# Patient Record
Sex: Male | Born: 2000 | Race: White | Hispanic: No | Marital: Single | Smoking: Never smoker
Health system: Southern US, Community
[De-identification: ages and names within clinical notes are randomized; demographics above are authoritative.]

## PROBLEM LIST (undated history)

## (undated) DIAGNOSIS — Z803 Family history of malignant neoplasm of breast: Secondary | ICD-10-CM

## (undated) HISTORY — DX: Family history of malignant neoplasm of breast: Z80.3

---
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
## 2020-09-10 IMAGING — DX DG ANKLE COMPLETE 3+V*R*
3 series · 3 of 3 positions shown · non-contrast
Comparison: None.

CLINICAL DATA: 18-year-old male with trauma to the right ankle.

EXAM:
RIGHT ANKLE - COMPLETE 3+ VIEW

[ankle ap]
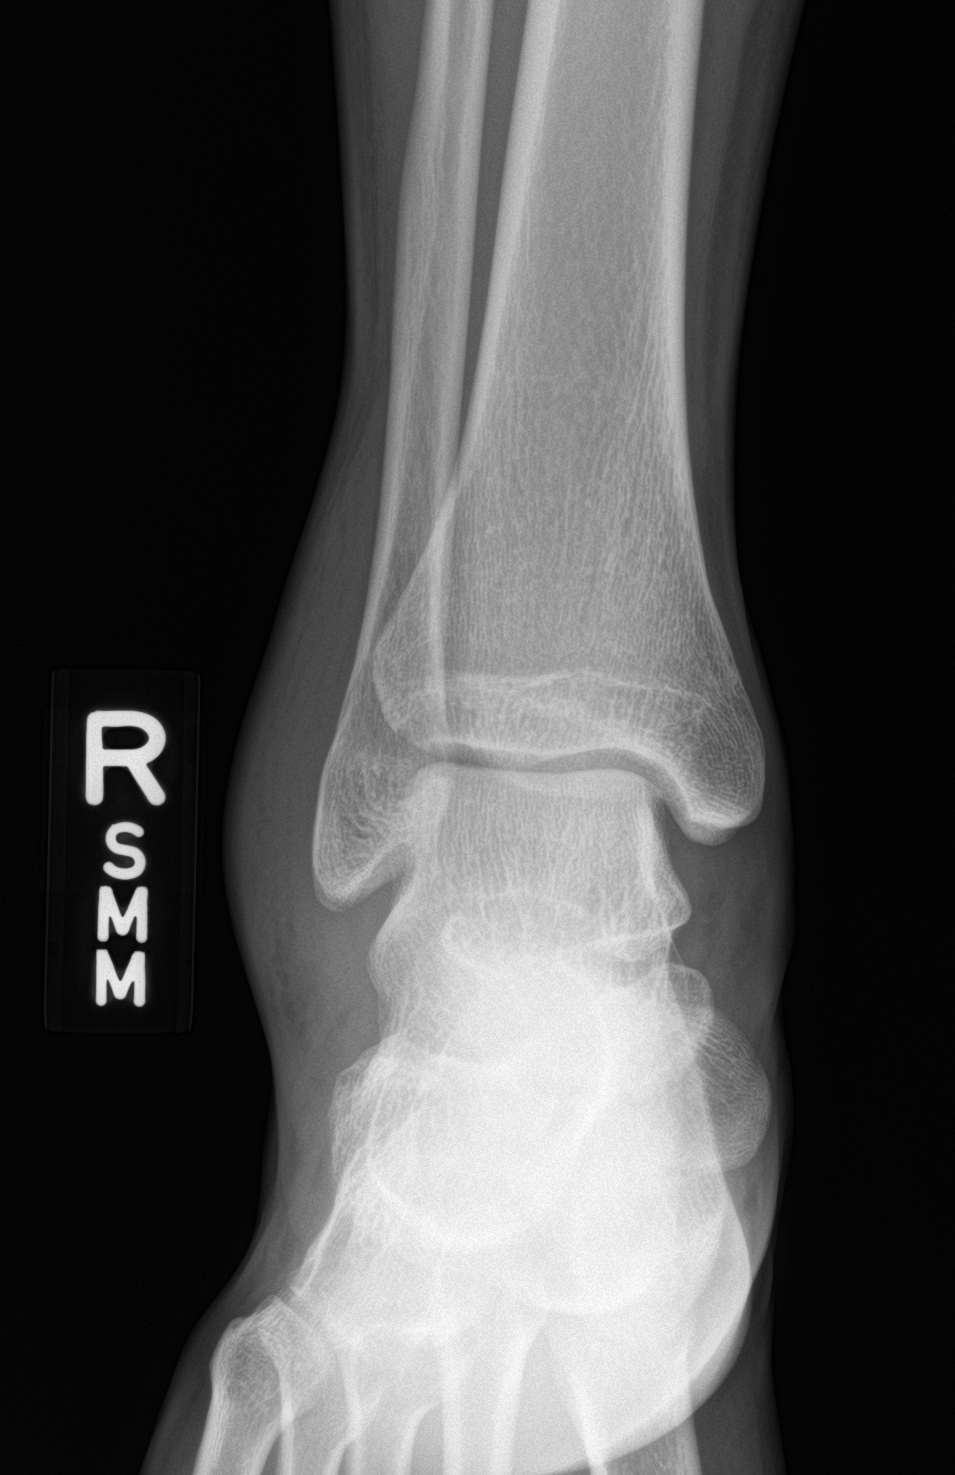

[ankle obl]
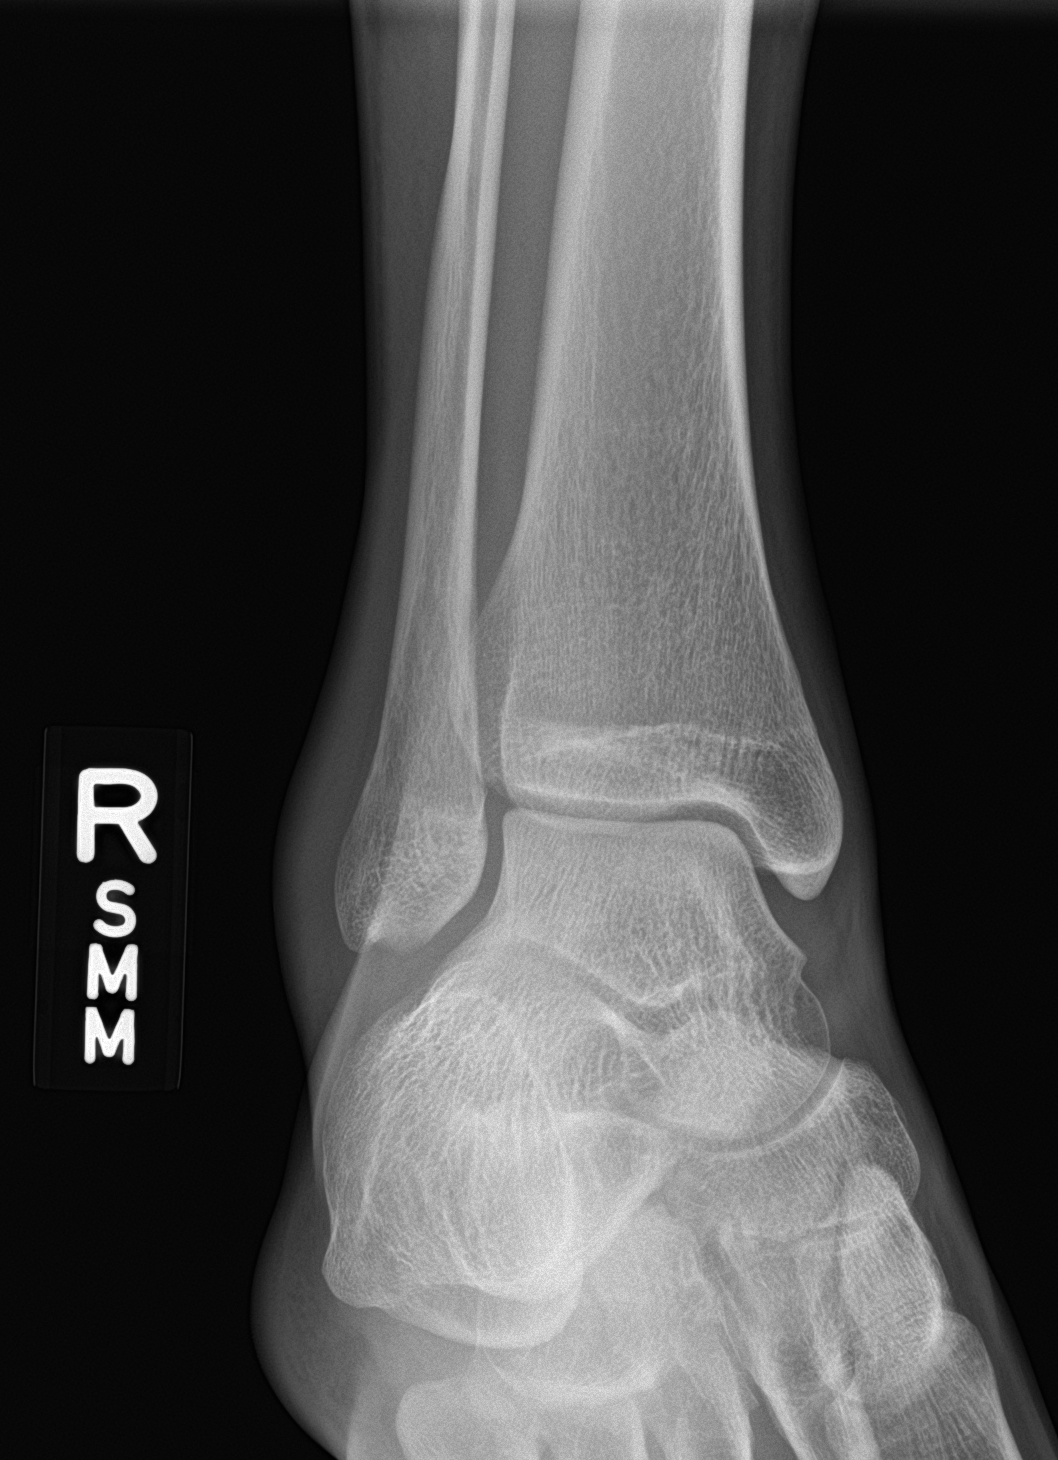

[ankle lat]
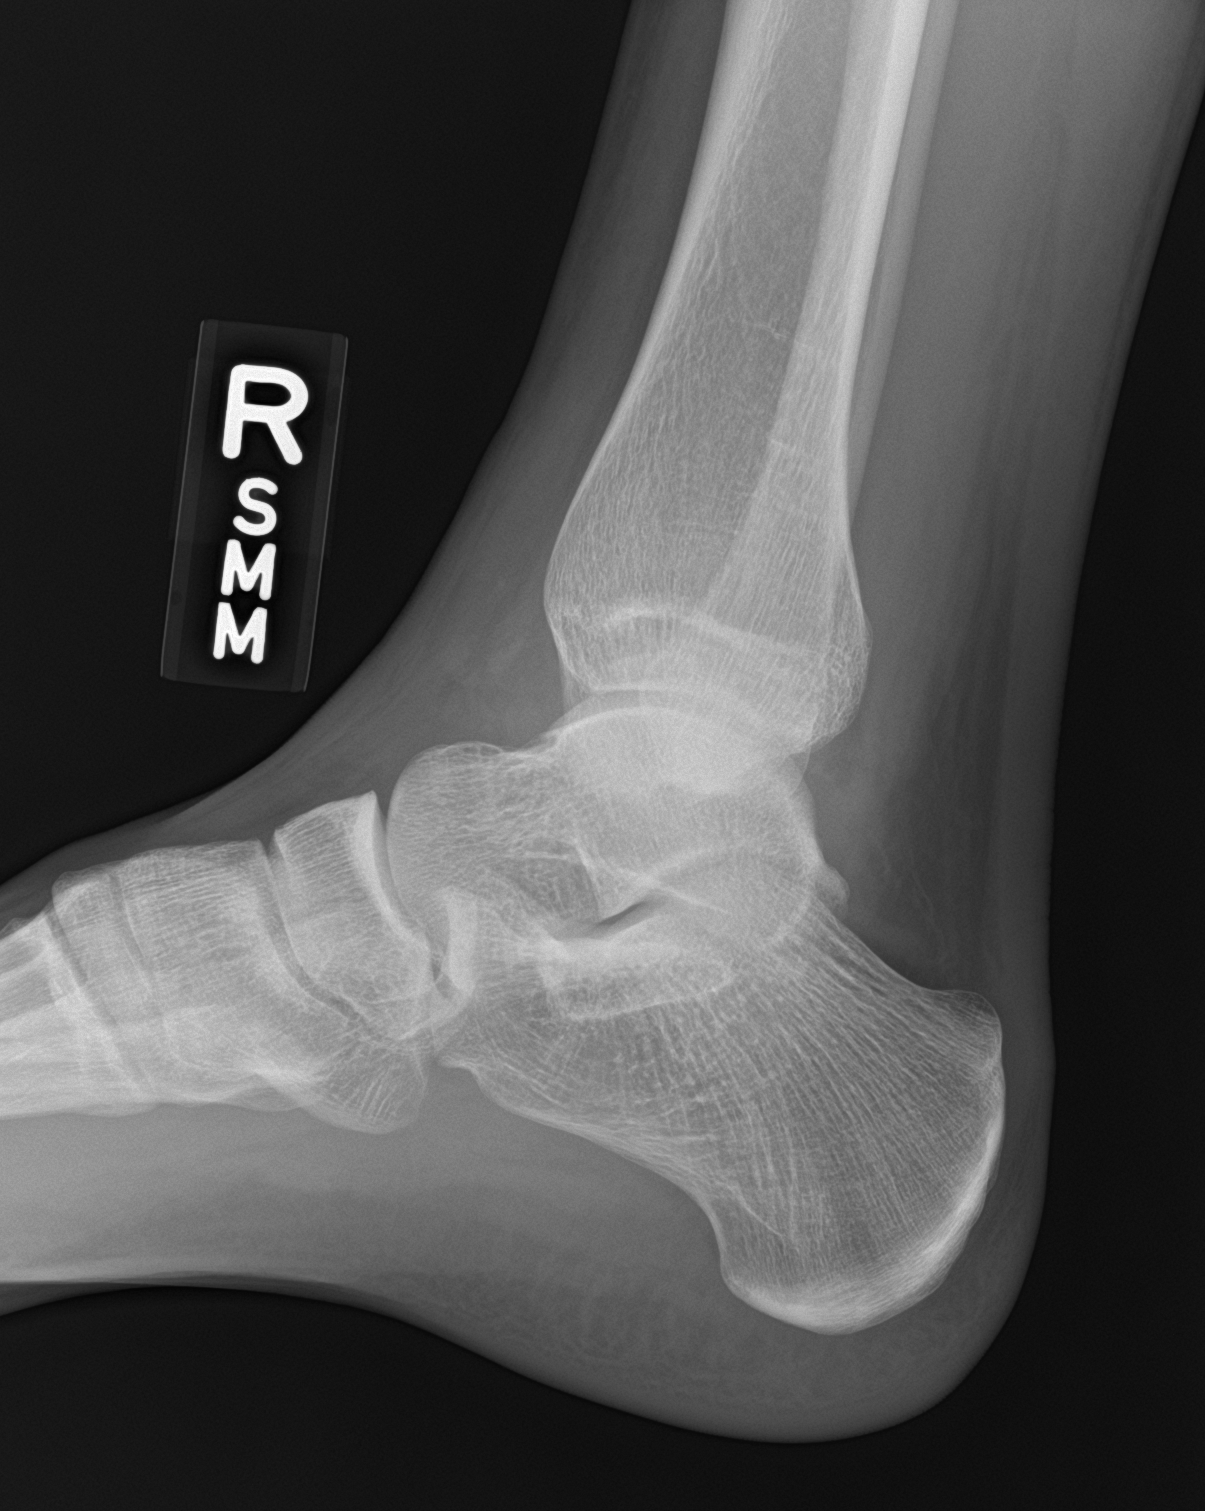

[3 of 3 positions shown; findings below may reference images not displayed]

FINDINGS: There is no acute fracture or dislocation. The bones are well
mineralized. No arthritic changes. The ankle mortise is intact. The
soft tissue swelling over the lateral malleolus. No radiopaque
foreign object or soft tissue gas.
IMPRESSION: No acute fracture or dislocation.

## 2021-03-09 ENCOUNTER — Other Ambulatory Visit: Payer: Self-pay | Admitting: Genetic Counselor

## 2021-03-09 DIAGNOSIS — Z803 Family history of malignant neoplasm of breast: Secondary | ICD-10-CM

## 2021-03-10 ENCOUNTER — Inpatient Hospital Stay: Payer: 59

## 2021-03-10 ENCOUNTER — Inpatient Hospital Stay: Payer: 59 | Attending: Genetic Counselor | Admitting: Genetic Counselor

## 2021-03-10 ENCOUNTER — Other Ambulatory Visit: Payer: Self-pay

## 2021-03-10 ENCOUNTER — Encounter: Payer: Self-pay | Admitting: Genetic Counselor

## 2021-03-10 DIAGNOSIS — Z803 Family history of malignant neoplasm of breast: Secondary | ICD-10-CM

## 2021-03-10 NOTE — Progress Notes (Addendum)
REFERRING PROVIDER: Chauncey Cruel, MD [place],  [place] [id number]  PRIMARY PROVIDER:  Patient, [name]  PRIMARY REASON FOR VISIT:  1. Family history of breast cancer      HISTORY OF PRESENT ILLNESS:   Cory [name], a 20 y.o. male, was seen for a [place] cancer genetics consultation at the request of Dr. Jana Hakim due to a family history of breast cancer.  Cory [name] presents to clinic today to discuss the possibility of a hereditary predisposition to cancer, genetic testing, and to further clarify his future cancer risks, as well as potential cancer risks for family members.   Cory [name] is a 20 y.o. male with no personal history of cancer.  He is here with his sister to learn more about hereditary cancer testing  CANCER HISTORY:  Oncology History   No history exists.    Past Medical History:  Diagnosis Date   Family history of breast cancer     No past surgical history on file.  Social History   Socioeconomic History   Marital status: Single    Spouse name: Not on file   Number of children: Not on file   Years of education: Not on file   Highest education level: Not on file  Occupational History   Not on file  Tobacco Use   Smoking status: Never   Smokeless tobacco: Never  Substance and Sexual Activity   Alcohol use: Yes    Comment: occ   Drug use: Never   Sexual activity: Not on file  Other Topics Concern   Not on file  Social History Narrative   Not on file   Social Determinants of Health   Financial Resource Strain: Not on file  Food Insecurity: Not on file  Transportation Needs: Not on file  Physical Activity: Not on file  Stress: Not on file  Social Connections: Not on file     FAMILY HISTORY:  We obtained a detailed, 4-generation family history.  Significant diagnoses are listed below: Family History  Problem Relation Age of Onset   Breast cancer Mother 59   Cervical cancer Maternal Grandmother 4   Skin  cancer Maternal Grandfather      The patient does not have children.  He has one sister who is cancer free. Both parents are living.    His mother was diagnosed with breast cancer at 61.  She tested positive for a pathogenic mutation in ATM.  She has a brother and sister who are cancer free.  Her parents are living.  Her mother had cervical cancer and her father had skin cancer.  The patient's father is living.  He has two brothers who are cancer free.  The paternal grandfather is deceased from a car accident and the grandmother is living.  Cory [name] is aware of previous family history of genetic testing for hereditary cancer risks. Patient's maternal ancestors are of [place] and [place] descent, and paternal ancestors are of [place] descent. There is no reported Ashkenazi Jewish ancestry. There is no known consanguinity.  GENETIC COUNSELING ASSESSMENT: Cory [name] is a 20 y.o. male with a family history of breast cancer which is somewhat suggestive of a hereditary cancer syndrome and predisposition to cancer given the young age of onset and the known ATM mutation. We, therefore, discussed and recommended the following at today's visit.   DISCUSSION: We discussed that, in general, most cancer is not inherited in families, but instead is sporadic or familial. Sporadic  cancers occur by chance and typically happen at older ages (>50 years) as this type of cancer is caused by genetic changes acquired during an individuals lifetime. Some families have more cancers than would be expected by chance; however, the ages or types of cancer are not consistent with a known genetic mutation or known genetic mutations have been ruled out. This type of familial cancer is thought to be due to a combination of multiple genetic, environmental, hormonal, and lifestyle factors. While this combination of factors likely increases the risk of cancer, the exact source of this risk is not currently identifiable or testable.  We  discussed that 5 - 10% of breast cancer is hereditary, with most cases associated with BRCA mutations.  The breast cancer that is running in the family is due to a pathogenic mutation in ATM.  The patient has a 50% chance of inheriting this mutation.  If he inherits this mutation he may be at slightly higher risk for pancreatic cancer, but we are no aware of ATM mutations increasing the risk for male breast cancer.  However, he could pass this mutation on to his children.  We will offer an extended panel.  There are other genes that can be associated with hereditary cancer syndromes.  While it does not seem likely, due to the family history, there is a chance that the patient could test positive for something else.  We discussed that testing is beneficial for several reasons including knowing how to follow individuals and understand if other family members could be at risk for cancer and allow them to undergo genetic testing.   We reviewed the characteristics, features and inheritance patterns of hereditary cancer syndromes. We also discussed genetic testing, including the appropriate family members to test, the process of testing, insurance coverage and turn-around-time for results. We discussed the implications of a negative, positive, carrier and/or variant of uncertain significant result. We recommended Cory [name] pursue genetic testing for the CancerNext-Expanded gene panel.   The CancerNext-Expanded gene panel offered by [facility] and includes sequencing and rearrangement analysis for the following 77 genes: AIP, ALK, APC*, ATM*, AXIN2, BAP1, BARD1, BLM, BMPR1A, BRCA1*, BRCA2*, BRIP1*, CDC73, CDH1*, CDK4, CDKN1B, CDKN2A, CHEK2*, CTNNA1, DICER1, FANCC, FH, FLCN, GALNT12, KIF1B, LZTR1, MAX, MEN1, MET, MLH1*, MSH2*, MSH3, MSH6*, MUTYH*, NBN, NF1*, NF2, NTHL1, PALB2*, PHOX2B, PMS2*, POT1, PRKAR1A, PTCH1, PTEN*, RAD51C*, RAD51D*, RB1, RECQL, RET, SDHA, SDHAF2, SDHB, SDHC, SDHD, SMAD4, SMARCA4, SMARCB1,  SMARCE1, STK11, SUFU, TMEM127, TP53*, TSC1, TSC2, VHL and XRCC2 (sequencing and deletion/duplication); EGFR, EGLN1, HOXB13, KIT, MITF, PDGFRA, POLD1, and POLE (sequencing only); EPCAM and GREM1 (deletion/duplication only). DNA and RNA analyses performed for * genes.   We discussed that some people do not want to undergo genetic testing due to fear of genetic discrimination.  A federal law called the Genetic Information Non-Discrimination Act (GINA) of 2008 helps protect individuals against genetic discrimination based on their genetic test results.  It impacts both health insurance and employment.  With health insurance, it protects against increased premiums, being kicked off insurance or being forced to take a test in order to be insured.  For employment it protects against hiring, firing and promoting decisions based on genetic test results.  Health status due to a cancer diagnosis is not protected under GINA.   Based on Cory [name] family history of cancer, he meets medical criteria for genetic testing. Despite that he meets criteria, he may still have an out of pocket cost. We discussed that if his out  of pocket cost for testing is over $100, the laboratory will call and confirm whether he wants to proceed with testing.  If the out of pocket cost of testing is less than $100 he will be billed by the genetic testing laboratory.   PLAN: After considering the risks, benefits, and limitations, Cory [name] provided informed consent to pursue genetic testing and a saliva sample was sent to [facility] for analysis of the CancerNext-expanded. Results should be available within approximately 2-3 weeks' time, at which point they will be disclosed by telephone to Cory [name], as will any additional recommendations warranted by these results. Cory [name] will receive a summary of his genetic counseling visit and a copy of his results once available. This information will also be available in Epic.   Lastly,  we encouraged Cory [name] to remain in contact with cancer genetics annually so that we can continuously update the family history and inform him of any changes in cancer genetics and testing that may be of benefit for this family.   Cory [name] questions were answered to his satisfaction today. Our contact information was provided should additional questions or concerns arise. Thank you for the referral and allowing [place] to share in the care of your patient.   Kalecia Hartney P. Florene Glen, [place], [facility] Licensed, Insurance risk surveyor [email or web address] phone: [phone]  The patient was seen for a total of 45 minutes in face-to-face genetic counseling.  The patient brought his sister. This patient was discussed with Drs. Magrinat, Lindi Adie and/or Burr Medico who agrees with the above.    _______________________________________________________________________ For Office Staff:  Number of people involved in session: 2 Was an Intern/ student involved with case: no

## 2021-04-06 ENCOUNTER — Encounter: Payer: Self-pay | Admitting: Genetic Counselor

## 2021-04-06 ENCOUNTER — Telehealth: Payer: Self-pay | Admitting: Genetic Counselor

## 2021-04-06 DIAGNOSIS — Z1379 Encounter for other screening for genetic and chromosomal anomalies: Secondary | ICD-10-CM | POA: Insufficient documentation

## 2021-04-06 NOTE — Telephone Encounter (Signed)
LM on VM that results are back and to please call. 

## 2021-04-12 NOTE — Telephone Encounter (Signed)
Left second message that results are back and for him to please call. Left CB instructions.

## 2021-04-15 ENCOUNTER — Ambulatory Visit: Payer: Self-pay | Admitting: Genetic Counselor

## 2021-04-15 DIAGNOSIS — Z1379 Encounter for other screening for genetic and chromosomal anomalies: Secondary | ICD-10-CM

## 2021-04-15 NOTE — Telephone Encounter (Signed)
Revealed negative genetic testing.  Discussed that knowing his mother has an ATM variant and he does not, this is considered a true negative test.  This means he is not placed at increased risk for ATM - related cancers and is not at risk for passing on this risk.

## 2021-04-15 NOTE — Progress Notes (Signed)
HPI:  [name] was previously seen in the [place] clinic due to a family history of cancer and a known ATM variant causing concerns regarding a hereditary predisposition to cancer. Please refer to our prior cancer genetics clinic note for more information regarding our discussion, assessment and recommendations, at the time. [name] recent genetic test results were disclosed to him, as were recommendations warranted by these results. These results and recommendations are discussed in more detail below.  CANCER HISTORY:  Oncology History   No history exists.    FAMILY HISTORY:  We obtained a detailed, 4-generation family history.  Significant diagnoses are listed below: Family History  Problem Relation Age of Onset   Breast cancer Mother 8   Cervical cancer Maternal Grandmother 53   Skin cancer Maternal Grandfather       The patient does not have children.  He has one sister who is cancer free. Both parents are living.     His mother was diagnosed with breast cancer at 47.  She tested positive for a pathogenic mutation in ATM.  She has a brother and sister who are cancer free.  Her parents are living.  Her mother had cervical cancer and her father had skin cancer.   The patient's father is living.  He has two brothers who are cancer free.  The paternal grandfather is deceased from a car accident and the grandmother is living.   [name] is aware of previous family history of genetic testing for hereditary cancer risks. Patient's maternal ancestors are of [place] and [place] descent, and paternal ancestors are of [place] descent. There is no reported Ashkenazi Jewish ancestry. There is no known consanguinity.  GENETIC TEST RESULTS: Genetic testing reported out on April 05, 2021 through the CancerNext-Expanded cancer panel found no pathogenic mutations. The CancerNext gene panel offered by [facility] includes sequencing and rearrangement analysis for the following 34  genes:   APC, ATM, BARD1, BMPR1A, BRCA1, BRCA2, BRIP1, CDH1, CDK4, CDKN2A, CHEK2, DICER1, HOXB13, EPCAM, GREM1, MLH1, MRE11A, MSH2, MSH6, MUTYH, NBN, NF1, PALB2, PMS2, POLD1, POLE, PTEN, RAD50, RAD51C, RAD51D, SMAD4, SMARCA4, STK11, and TP53. The test report has been scanned into EPIC and is located under the Molecular Pathology section of the Results Review tab.  A portion of the result report is included below for reference.     We discussed with [name] that because current genetic testing is not perfect, it is possible there may be a gene mutation in one of these genes that current testing cannot detect, but that chance is small.  We also discussed, that there could be another gene that has not yet been discovered, or that we have not yet tested, that is responsible for the cancer diagnoses in the family. It is also possible there is a hereditary cause for the cancer in the family that [name] did not inherit and therefore was not identified in his testing.  Therefore, it is important to remain in touch with cancer genetics in the future so that we can continue to offer [name] the most up to date genetic testing.   We call this result a true negative result because the cancer-causing mutation was identified in Mr. Belair family, and he did not inherit it.  Given this negative result, [name] chances of developing ATM-related cancers are the same as they are in the general population.   ADDITIONAL GENETIC TESTING: We discussed with [name] that his genetic testing was fairly extensive.  If there are genes identified to increase cancer risk that can be analyzed in the future, we would be happy to discuss and coordinate this testing at that time.    CANCER SCREENING RECOMMENDATIONS: [name] test result is considered negative (normal).  An individual's cancer risk and medical management are not determined by genetic test results alone. Overall cancer risk assessment incorporates additional  factors, including personal medical history, family history, and any available genetic information that may result in a personalized plan for cancer prevention and surveillance  RECOMMENDATIONS FOR FAMILY MEMBERS:  Individuals in this family might be at some increased risk of developing cancer, over the general population risk, simply due to the family history of cancer.  We recommended women in this family have a yearly mammogram beginning at age 62, or 62 years younger than the earliest onset of cancer, an annual clinical breast exam, and perform monthly breast self-exams. Women in this family should also have a gynecological exam as recommended by their primary provider. All family members should be referred for colonoscopy starting at age 40.  FOLLOW-UP: Lastly, we discussed with [name] that cancer genetics is a rapidly advancing field and it is possible that new genetic tests will be appropriate for him and/or his family members in the future. We encouraged him to remain in contact with cancer genetics on an annual basis so we can update his personal and family histories and let him know of advances in cancer genetics that may benefit this family.   Our contact number was provided. [name] questions were answered to his satisfaction, and he knows he is welcome to call us at anytime with additional questions or concerns.   Roma Kayser, [place], [facility] Licensed, Certified Genetic Counselor [email or web address]
